# Patient Record
Sex: Male | Born: 1995 | Race: White | Hispanic: No | Marital: Single | Smoking: Never smoker
Health system: Southern US, Community
[De-identification: ages and names within clinical notes are randomized; demographics above are authoritative.]

## PROBLEM LIST (undated history)

## (undated) DIAGNOSIS — E079 Disorder of thyroid, unspecified: Secondary | ICD-10-CM

## (undated) DIAGNOSIS — E039 Hypothyroidism, unspecified: Secondary | ICD-10-CM

---
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
## 2015-01-20 DIAGNOSIS — S95101A Unspecified injury of plantar artery of right foot, initial encounter: Secondary | ICD-10-CM | POA: Diagnosis not present

## 2015-01-21 ENCOUNTER — Other Ambulatory Visit: Payer: Self-pay | Admitting: Family Medicine

## 2015-01-21 ENCOUNTER — Ambulatory Visit
Admission: RE | Admit: 2015-01-21 | Discharge: 2015-01-21 | Disposition: A | Discharge status: Home / Self Care | Payer: Managed Care, Other (non HMO) | Source: Ambulatory Visit | Attending: Family Medicine | Admitting: Family Medicine

## 2015-01-21 DIAGNOSIS — R52 Pain, unspecified: Secondary | ICD-10-CM

## 2015-01-21 DIAGNOSIS — M79671 Pain in right foot: Secondary | ICD-10-CM | POA: Insufficient documentation

## 2015-12-30 ENCOUNTER — Ambulatory Visit (INDEPENDENT_AMBULATORY_CARE_PROVIDER_SITE_OTHER): Payer: Managed Care, Other (non HMO) | Admitting: Family Medicine

## 2015-12-30 VITALS — BP 137/76 | HR 64 | Temp 97.8°F | Resp 14

## 2015-12-30 DIAGNOSIS — R5383 Other fatigue: Secondary | ICD-10-CM

## 2015-12-30 NOTE — Progress Notes (Signed)
Patient presents today for blood work. Patient denies any restrictive diet. He has no history of vitamin D deficiency or anemia. He does not take any supplements. 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 He denies any issues with sleep or appetite. He denies any mood issues. He denies any history of stress injuries. He is a cross-country runner.  ROS: Negative except mentioned above.  Vitals as per Epic.  GENERAL: NAD RESP: CTA B CARD: RRR NEURO: CN II-XII grossly intact   A/P: Sports physical for labs - will draw CBC, ferritin level, vitamin D level. On form patient if any labs are abnormal.

## 2015-12-31 LAB — CBC WITH DIFFERENTIAL/PLATELET
BASOS ABS: 0 10*3/uL (ref 0.0–0.2)
Basos: 1 %
EOS (ABSOLUTE): 0 10*3/uL (ref 0.0–0.4)
Eos: 1 %
Hematocrit: 46.4 % (ref 37.5–51.0)
Hemoglobin: 16 g/dL (ref 12.6–17.7)
Immature Grans (Abs): 0 10*3/uL (ref 0.0–0.1)
Immature Granulocytes: 0 %
LYMPHS ABS: 0.9 10*3/uL (ref 0.7–3.1)
Lymphs: 14 %
MCH: 29.6 pg (ref 26.6–33.0)
MCHC: 34.5 g/dL (ref 31.5–35.7)
MCV: 86 fL (ref 79–97)
MONOCYTES: 10 %
Monocytes Absolute: 0.6 10*3/uL (ref 0.1–0.9)
Neutrophils Absolute: 4.8 10*3/uL (ref 1.4–7.0)
Neutrophils: 74 %
Platelets: 184 10*3/uL (ref 150–379)
RBC: 5.41 x10E6/uL (ref 4.14–5.80)
RDW: 13.6 % (ref 12.3–15.4)
WBC: 6.4 10*3/uL (ref 3.4–10.8)

## 2015-12-31 LAB — FERRITIN: FERRITIN: 118 ng/mL (ref 30–400)

## 2015-12-31 LAB — VITAMIN D 25 HYDROXY (VIT D DEFICIENCY, FRACTURES): Vit D, 25-Hydroxy: 57.3 ng/mL (ref 30.0–100.0)

## 2016-01-13 ENCOUNTER — Ambulatory Visit
Admission: RE | Admit: 2016-01-13 | Discharge: 2016-01-13 | Disposition: A | Discharge status: Home / Self Care | Payer: Managed Care, Other (non HMO) | Source: Ambulatory Visit | Attending: Family Medicine | Admitting: Family Medicine

## 2016-01-13 ENCOUNTER — Ambulatory Visit (INDEPENDENT_AMBULATORY_CARE_PROVIDER_SITE_OTHER): Payer: Managed Care, Other (non HMO) | Admitting: Family Medicine

## 2016-01-13 ENCOUNTER — Other Ambulatory Visit: Payer: Self-pay | Admitting: Family Medicine

## 2016-01-13 DIAGNOSIS — M79604 Pain in right leg: Secondary | ICD-10-CM

## 2016-01-13 NOTE — Progress Notes (Signed)
Patient presents today with symptoms of right leg pain. Patient states that he noticed the pain approximately 2 weeks ago. He crosstrained for a few days and then raced. He noticed the pain increased after that and has started to have pain now at rest. He denies any femur stress injury in the past. He has dullish insolence in the past. He denies any increase in mileage recently. He states that for over a month he has been doing about 70 miles a week. He does think that there has been an increase in intensity with practice this season. Last vitamin D level was drawn last month which was in the 50s. Patient denies any restrictions and diet. He denies any other pain anywhere else. He typically changes at his shoes every 300 miles.  ROS: Negative except mentioned above.  Vitals as per Epic.  GENERAL: NAD RESP: CTA B CARD: RRR MSK: Right lower leg - no obvious ecchymosis or deformity, full range of motion, normal hip motion bilaterally, no tenderness to palpation but patient points to the proximal to mid femur as location of pain, positive hop test, slight overpronation with walking, NV intact NEURO: CN II-XII grossly intact   A/P: Right lower extremity pain  - stress injury suspected, will do x-rays initially, nonweightbearing using crutches if pain with walking, will order MRI once x-rays have been looked at, NSAIDs when necessary.

## 2016-01-16 ENCOUNTER — Ambulatory Visit
Admission: RE | Admit: 2016-01-16 | Discharge: 2016-01-16 | Disposition: A | Discharge status: Home / Self Care | Payer: Managed Care, Other (non HMO) | Source: Ambulatory Visit | Attending: Family Medicine | Admitting: Family Medicine

## 2016-01-16 DIAGNOSIS — M79604 Pain in right leg: Secondary | ICD-10-CM | POA: Insufficient documentation

## 2016-01-16 DIAGNOSIS — R937 Abnormal findings on diagnostic imaging of other parts of musculoskeletal system: Secondary | ICD-10-CM | POA: Diagnosis not present

## 2016-01-20 ENCOUNTER — Encounter: Payer: Self-pay | Admitting: Family Medicine

## 2016-01-20 ENCOUNTER — Ambulatory Visit (INDEPENDENT_AMBULATORY_CARE_PROVIDER_SITE_OTHER): Payer: Managed Care, Other (non HMO) | Admitting: Family Medicine

## 2016-01-20 DIAGNOSIS — M79604 Pain in right leg: Secondary | ICD-10-CM

## 2016-01-20 NOTE — Progress Notes (Signed)
Patient presents for follow-up regarding right leg pain. Patient states that he has been nonweightbearing on crutches which is helping his pain. He however does feel pain when he starts to weight bear on the right leg. He does have soreness in the leg in the morning when he gets up. He denies any bruising or swelling of the area. His MRI showed what looks like periosteal reaction along the medial aspect of the femur shaft. There is some question of a vastus medialis tear at the insertion. The radiologist did not see any bone edema. Patient's pain however has been gradual and he does not recall any particular incident when the pain started. He has not been taking any medications for his symptoms.  ROS: Negative except mentioned above.  Vitals as per Epic.  GENERAL: NAD RESP: CTA B CARD: RRR MSK: Right femur - no tenderness to palpation of groin/femur, no ecchymosis or swelling appreciated, full range of motion of the right lower extremity, minimal discomfort with resisted adduction, negative Fulcram, mild discomfort with Fabers, negative Homans, NV intact NEURO: CN II-XII grossly intact   A/P: Right lower extremity pain -I have asked Dr. Ardine Engiehl to review the patient's MRI, if still suspicious for bony abnormality may suggest doing further imaging, encourage patient to take NSAIDs when necessary, continue to be nonweightbearing. Will follow-up with patient after I have heard from Dr. Ardine Engiehl.

## 2016-02-03 ENCOUNTER — Encounter: Payer: Self-pay | Admitting: Family Medicine

## 2016-02-03 ENCOUNTER — Ambulatory Visit (INDEPENDENT_AMBULATORY_CARE_PROVIDER_SITE_OTHER): Payer: Managed Care, Other (non HMO) | Admitting: Family Medicine

## 2016-02-03 DIAGNOSIS — S8991XD Unspecified injury of right lower leg, subsequent encounter: Secondary | ICD-10-CM

## 2016-02-03 NOTE — Progress Notes (Signed)
Patient presents today for follow-up regarding right femoral stress injury. MRI was reviewed with Dr. Ardine Engiehl who does feel that patient likely has a stress reaction. No fracture line was seen on MRI. Patient states that he has been on crutches nonweightbearing now for about 2 weeks and has noticed improvement. He is able to walk some without crutches with no discomfort. He has tried some cross training at times but has stopped if he had pain develop. He admits to no running. He states that his mood is good.  ROS: Negative except mentioned above.  GENERAL: NAD MSK: no pain on palpation of right leg, full range of motion of right leg, negative Fulcrum, NV intact NEURO: CN II-XII grossly intact   A/P: Right femoral stress reaction - patient has improved since last visit, I have asked that he continue using the crutches as needed and to wean off as he has starts to have no pain with walking, started to incorporate cross training if pain free, will follow up with me in 2 weeks or sooner if needed. We'll discuss plan with trainer.

## 2016-07-22 ENCOUNTER — Ambulatory Visit (INDEPENDENT_AMBULATORY_CARE_PROVIDER_SITE_OTHER): Payer: Managed Care, Other (non HMO) | Admitting: Family Medicine

## 2016-07-22 ENCOUNTER — Encounter: Payer: Self-pay | Admitting: Family Medicine

## 2016-07-22 DIAGNOSIS — M79605 Pain in left leg: Secondary | ICD-10-CM

## 2016-07-22 NOTE — Progress Notes (Signed)
Patient presents with symptoms of left thigh pain for approx. aweek. He states he noticed the pain after a run and a hike that he did on the same day. He initially started to have pain more along the left greater trochanter area and then started to experience pain more in the medial upper thigh area. Patient has a hx of a stress reaction last year in the right femur. He admits to returning back to running in Dec. 2017. He was up to about 70 miles/week when the pain started. He denies any increase in mileage too quickly. He tends to run on concrete and when reviewing his running schedule it looks like he has one off day during the week and no cross training days. Most of his mileage seems to be over a consecutive three days. He does wear orthotics and denies any changes in shoewear recently. He does admit to changing out his shoewear every 400 miles herself. His last vitamin D level was 57 (12/2015). He recently started taking vitamin D 3 but is unsure about the dosage.  ROS: Negative except mentioned above. Vitals as per Epic GENERAL: NAD MSK: Left Leg - no gross abnormality, minimal tenderness to the greater trochanter area, mild hip flexor tightness, negative Fulcrum, minimal pain with hopping, NV intact NEURO: CN II-XII grossy intact   A/P: Left leg pain - would recommend a few off days from physical activity, can try cross training over the weekend if no pain, would reevaluate on Monday and if still having discomfort will send for imaging, discussed plan with trainer. Will repeat vitamin D level today. Patient will check in with trainer daily about symptoms.

## 2016-07-23 LAB — VITAMIN D 25 HYDROXY (VIT D DEFICIENCY, FRACTURES): VIT D 25 HYDROXY: 46.5 ng/mL (ref 30.0–100.0)

## 2016-10-25 IMAGING — MR MR FEMUR*R* W/O CM
4 of 5 series · 31 of 40 positions shown · non-contrast
Comparison: None.

CLINICAL DATA: Right mid thigh pain for 6 days. Quadriceps strain 2
weeks ago.

EXAM:
MRI OF THE RIGHT FEMUR WITHOUT CONTRAST
TECHNIQUE: Multiplanar, multisequence MR imaging of the right femur was
performed. No intravenous contrast was administered.

[Series 4: T1 · axial · 6.0mm · 1.19mm/px · z∈[-199,+197]mm · 9 of 45 slices shown (1 of 2)]
[im 1/45]
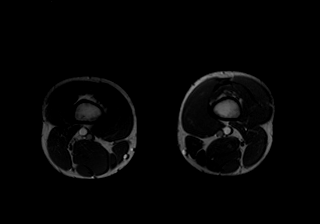
[im 9/45]
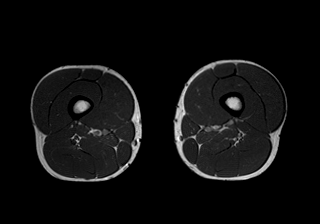
[im 13/45]
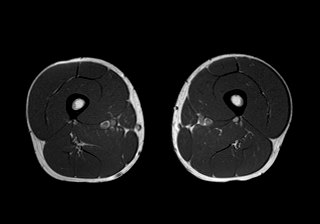
[im 21/45]
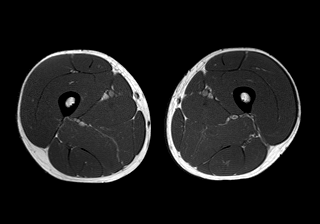
[im 25/45]
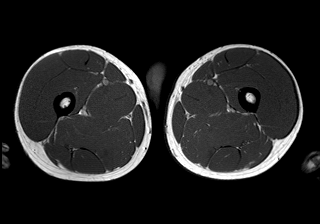
[im 33/45]
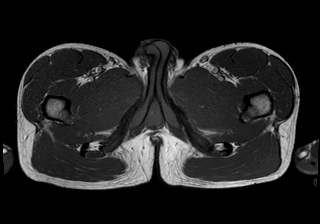
[im 37/45]
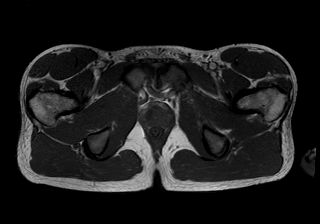
[im 41/45]
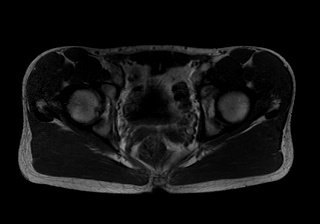
[im 45/45]
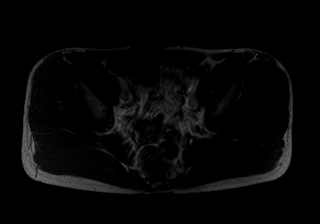

[Series 5: T2 fat-sat · axial · 6.0mm · 0.74mm/px · z∈[-199,+197]mm · 11 of 45 slices shown (1 of 2)]
[im 1/45]
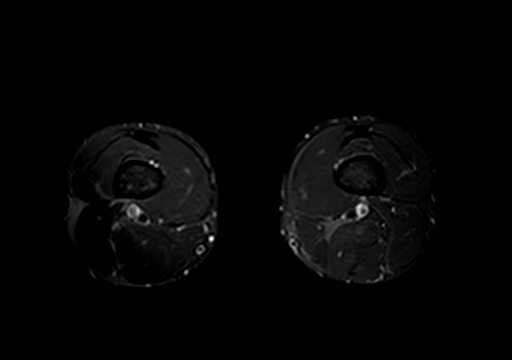
[im 5/45]
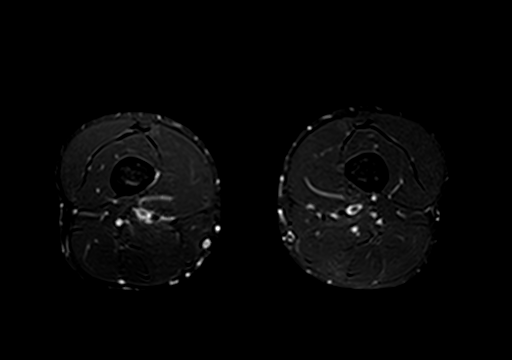
[im 9/45]
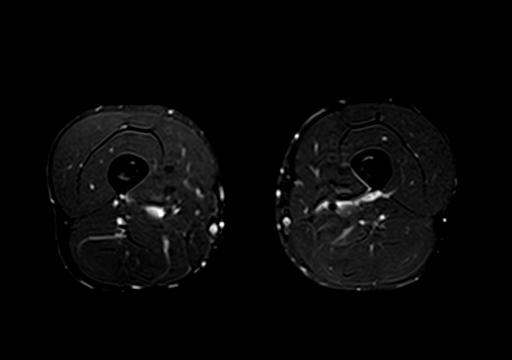
[im 14/45]
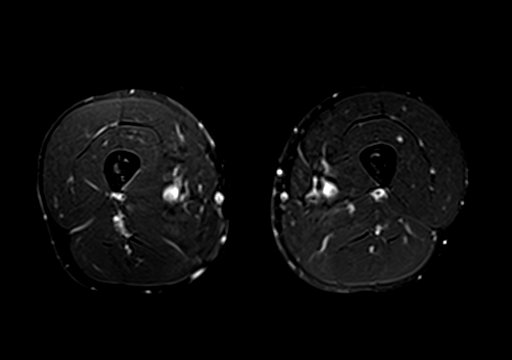
[im 18/45]
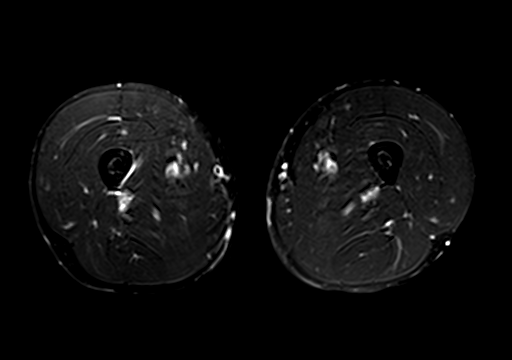
[im 23/45]
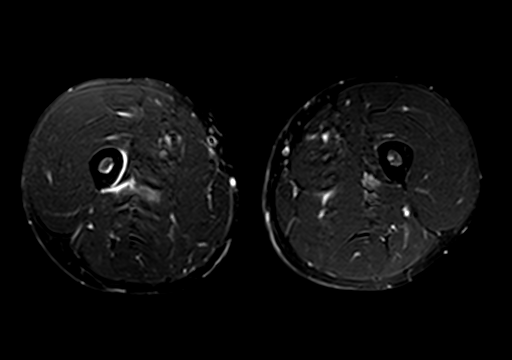
[im 27/45]
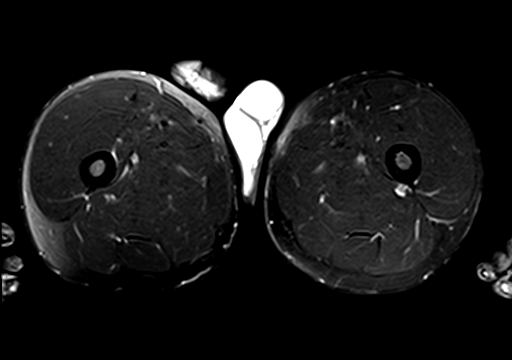
[im 31/45]
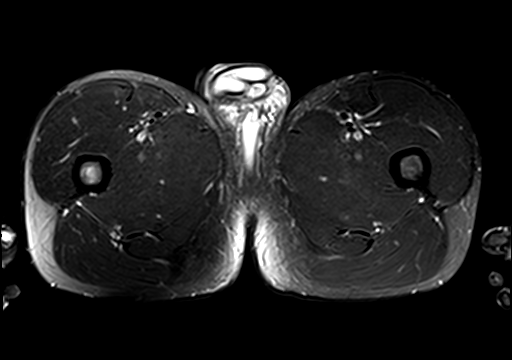
[im 36/45]
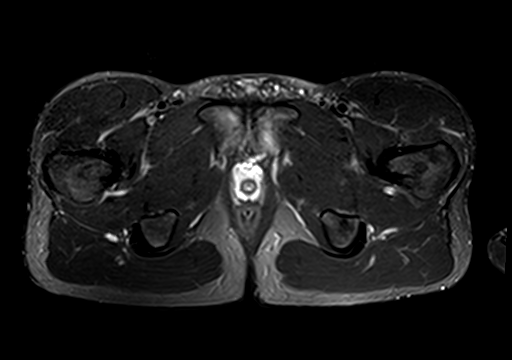
[im 40/45]
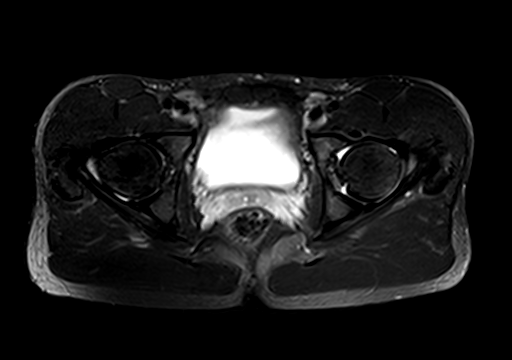
[im 45/45]
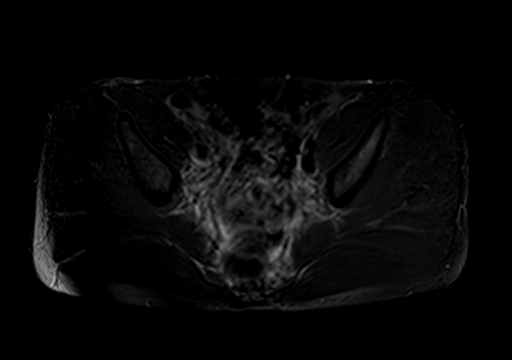

[Series 7: T1 · coronal · 4.0mm · 1.09mm/px · 4 of 31 slices shown (2 of 2)]
[im 1/31]
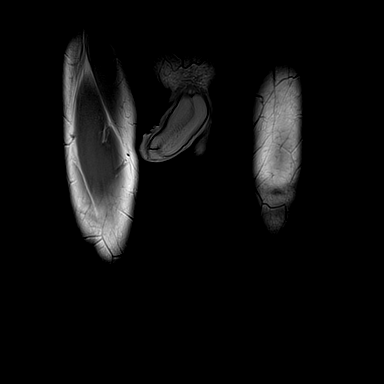
[im 6/31]
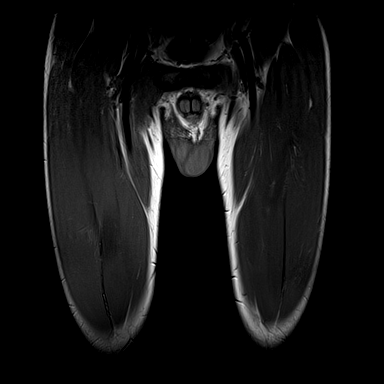
[im 16/31]
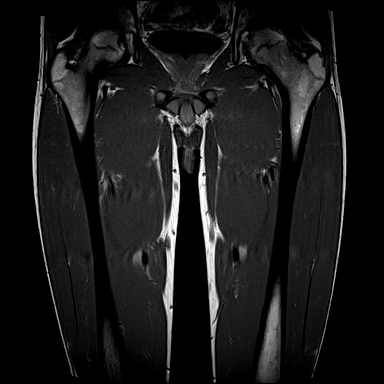
[im 26/31]
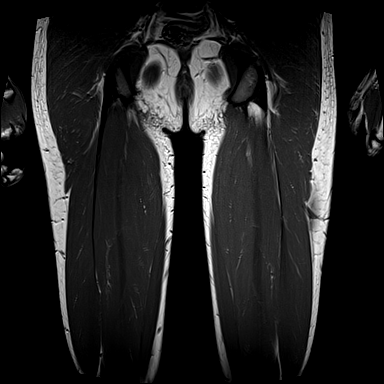

[Series 8: T2 fat-sat · coronal · 4.0mm · 0.82mm/px · 7 of 31 slices shown (2 of 2)]
[im 1/31]
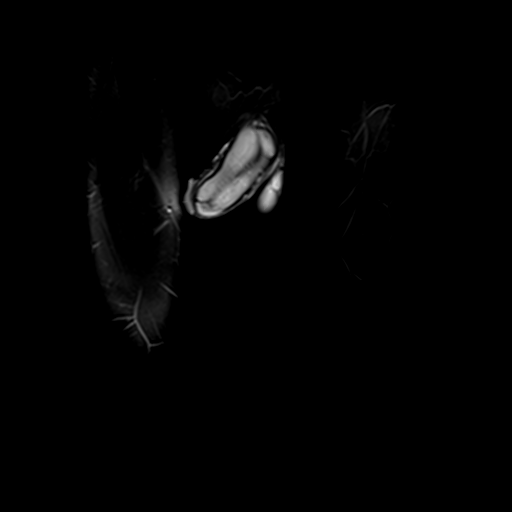
[im 6/31]
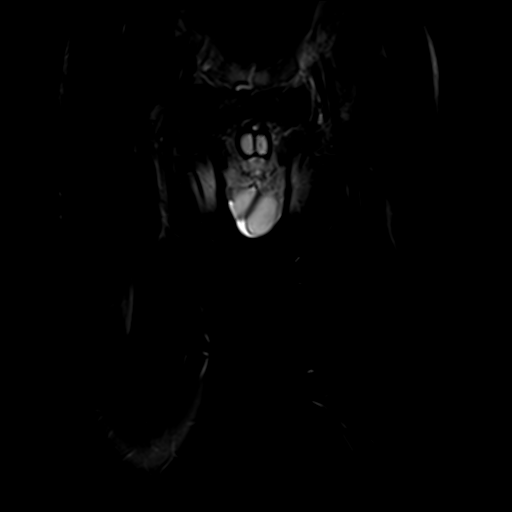
[im 11/31]
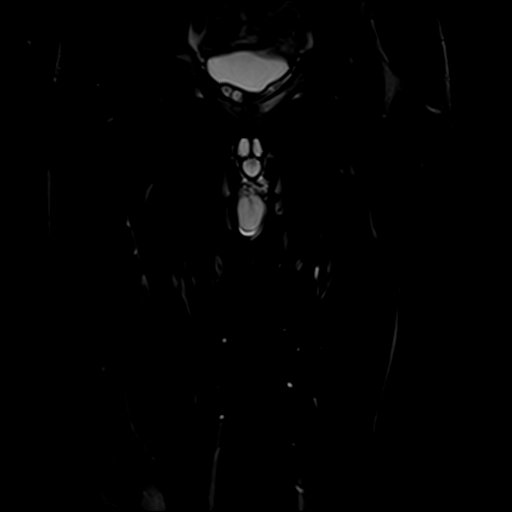
[im 16/31]
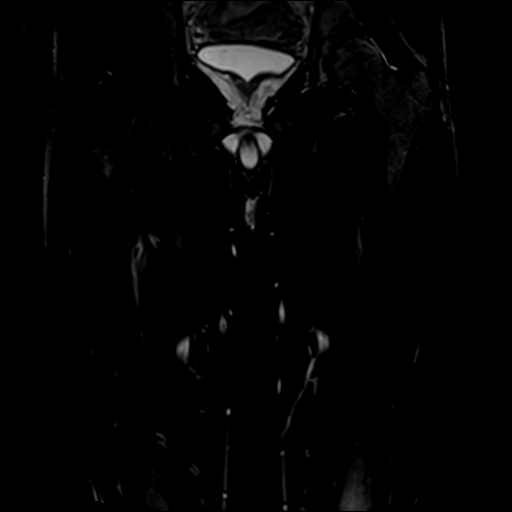
[im 21/31]
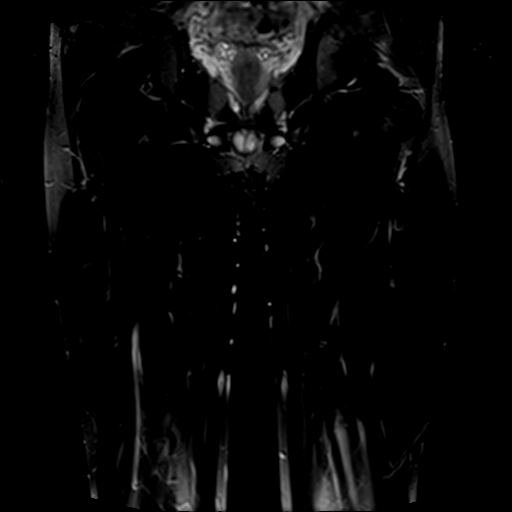
[im 26/31]
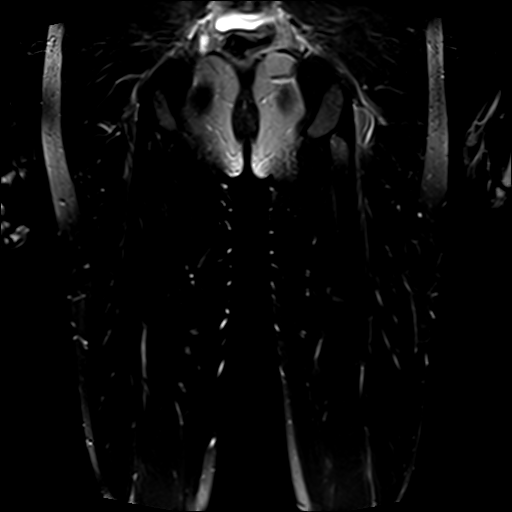
[im 31/31]
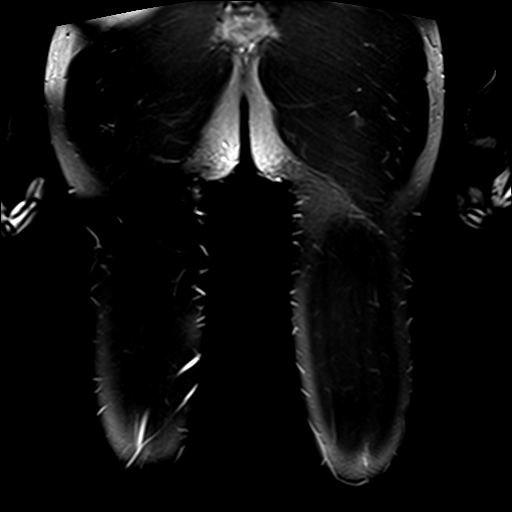

[31 of 40 positions shown; findings below may reference images not displayed]

FINDINGS: Bones/Joint/Cartilage

There is abnormal fluid signal tracking along the medial periosteal
margin of the right proximal femur starting just below the lesser
trochanter and extending about the pole 0.8 cm distally. I suspect
that this represents a tear or avulsion along the origin of the
vastus medialis origination on the femur. No abnormal marrow signal
in the femur is identified and the vastus musculature appears
otherwise unremarkable.

Hamstring tendons normal.  No additional significant findings.

Muscles and Tendons

See above

Soft tissues

Unremarkable
IMPRESSION: 1. Abnormal fluid signal tracking along the medial periosteal margin
of the right proximal femur, suspicious for tear along the
origination site of the vastus medialis muscle. No underlying marrow
edema identified.
